# Patient Record
Sex: Male | Born: 2005 | Race: White | Hispanic: No | Marital: Single | Smoking: Never smoker
Health system: Southern US, Community
[De-identification: ages and names within clinical notes are randomized; demographics above are authoritative.]

## PROBLEM LIST (undated history)

## (undated) DIAGNOSIS — J45909 Unspecified asthma, uncomplicated: Secondary | ICD-10-CM

## (undated) DIAGNOSIS — G47419 Narcolepsy without cataplexy: Secondary | ICD-10-CM

## (undated) DIAGNOSIS — T7840XA Allergy, unspecified, initial encounter: Secondary | ICD-10-CM

## (undated) DIAGNOSIS — Z8659 Personal history of other mental and behavioral disorders: Secondary | ICD-10-CM

## (undated) DIAGNOSIS — F913 Oppositional defiant disorder: Secondary | ICD-10-CM

## (undated) DIAGNOSIS — Z634 Disappearance and death of family member: Secondary | ICD-10-CM

## (undated) DIAGNOSIS — F909 Attention-deficit hyperactivity disorder, unspecified type: Secondary | ICD-10-CM

## (undated) DIAGNOSIS — F819 Developmental disorder of scholastic skills, unspecified: Secondary | ICD-10-CM

## (undated) DIAGNOSIS — F88 Other disorders of psychological development: Secondary | ICD-10-CM

## (undated) HISTORY — DX: Narcolepsy without cataplexy: G47.419

## (undated) HISTORY — DX: Disappearance and death of family member: Z63.4

## (undated) HISTORY — DX: Oppositional defiant disorder: F91.3

## (undated) HISTORY — DX: Allergy, unspecified, initial encounter: T78.40XA

## (undated) HISTORY — DX: Personal history of other mental and behavioral disorders: Z86.59

## (undated) HISTORY — DX: Attention-deficit hyperactivity disorder, unspecified type: F90.9

## (undated) HISTORY — DX: Developmental disorder of scholastic skills, unspecified: F81.9

## (undated) HISTORY — DX: Other disorders of psychological development: F88

## (undated) HISTORY — DX: Unspecified asthma, uncomplicated: J45.909

---
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
## 2024-06-29 ENCOUNTER — Encounter: Payer: Self-pay | Admitting: Neurology

## 2024-07-01 ENCOUNTER — Telehealth: Payer: MEDICAID | Admitting: Neurology

## 2024-07-08 ENCOUNTER — Telehealth: Payer: MEDICAID | Admitting: Neurology

## 2024-07-16 ENCOUNTER — Encounter: Payer: Self-pay | Admitting: *Deleted

## 2024-08-06 ENCOUNTER — Encounter: Payer: Self-pay | Admitting: Pediatrics

## 2024-08-06 ENCOUNTER — Ambulatory Visit: Payer: MEDICAID | Admitting: Pediatrics

## 2024-08-06 VITALS — BP 112/72 | HR 84 | Temp 98.3°F | Ht 66.0 in | Wt 190.4 lb

## 2024-08-06 DIAGNOSIS — R300 Dysuria: Secondary | ICD-10-CM

## 2024-08-06 DIAGNOSIS — J309 Allergic rhinitis, unspecified: Secondary | ICD-10-CM

## 2024-08-06 DIAGNOSIS — Z23 Encounter for immunization: Secondary | ICD-10-CM

## 2024-08-06 DIAGNOSIS — L210 Seborrhea capitis: Secondary | ICD-10-CM | POA: Diagnosis not present

## 2024-08-06 DIAGNOSIS — Z00121 Encounter for routine child health examination with abnormal findings: Secondary | ICD-10-CM

## 2024-08-06 DIAGNOSIS — H527 Unspecified disorder of refraction: Secondary | ICD-10-CM

## 2024-08-06 DIAGNOSIS — Z68.41 Body mass index (BMI) pediatric, greater than or equal to 95th percentile for age: Secondary | ICD-10-CM

## 2024-08-06 DIAGNOSIS — Z0001 Encounter for general adult medical examination with abnormal findings: Secondary | ICD-10-CM | POA: Diagnosis not present

## 2024-08-06 DIAGNOSIS — L858 Other specified epidermal thickening: Secondary | ICD-10-CM

## 2024-08-06 DIAGNOSIS — Z136 Encounter for screening for cardiovascular disorders: Secondary | ICD-10-CM

## 2024-08-06 DIAGNOSIS — G47419 Narcolepsy without cataplexy: Secondary | ICD-10-CM

## 2024-08-06 DIAGNOSIS — F88 Other disorders of psychological development: Secondary | ICD-10-CM

## 2024-08-06 LAB — POCT URINALYSIS DIPSTICK
Bilirubin, UA: NEGATIVE
Blood, UA: NEGATIVE
Glucose, UA: NEGATIVE
Ketones, UA: NEGATIVE
Leukocytes, UA: NEGATIVE
Nitrite, UA: NEGATIVE
Protein, UA: NEGATIVE
Spec Grav, UA: 1.01 (ref 1.010–1.025)
Urobilinogen, UA: 0.2 U/dL
pH, UA: 8 (ref 5.0–8.0)

## 2024-08-06 MED ORDER — CETIRIZINE HCL 10 MG PO TABS
10.0000 mg | ORAL_TABLET | Freq: Every day | ORAL | 5 refills | Status: AC
Start: 2024-08-06 — End: ?

## 2024-08-06 MED ORDER — KETOCONAZOLE 2 % EX SHAM: 1.0000 | MEDICATED_SHAMPOO | CUTANEOUS | 0 refills | Status: AC
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
## 2024-08-19 LAB — COMPREHENSIVE METABOLIC PANEL WITH GFR
AG Ratio: 2.3 (calc) (ref 1.0–2.5)
ALT: 47 U/L — ABNORMAL HIGH (ref 8–46)
AST: 24 U/L (ref 12–32)
Albumin: 4.9 g/dL (ref 3.6–5.1)
Alkaline phosphatase (APISO): 76 U/L (ref 46–169)
BUN: 15 mg/dL (ref 7–20)
CO2: 29 mmol/L (ref 20–32)
Calcium: 9.6 mg/dL (ref 8.9–10.4)
Chloride: 104 mmol/L (ref 98–110)
Creat: 1.05 mg/dL (ref 0.60–1.24)
Globulin: 2.1 g/dL (ref 2.1–3.5)
Glucose, Bld: 83 mg/dL (ref 65–99)
Potassium: 3.8 mmol/L (ref 3.8–5.1)
Sodium: 142 mmol/L (ref 135–146)
Total Bilirubin: 0.9 mg/dL (ref 0.2–1.1)
Total Protein: 7 g/dL (ref 6.3–8.2)
eGFR: 106 mL/min/1.73m2 (ref >=60)

## 2024-08-19 LAB — LIPID PANEL
Cholesterol: 147 mg/dL (ref <170)
HDL: 34 mg/dL — ABNORMAL LOW (ref >45)
LDL Cholesterol (Calc): 73 mg/dL (ref <110)
Non-HDL Cholesterol (Calc): 113 mg/dL (ref <120)
Total CHOL/HDL Ratio: 4.3 (calc) (ref <5.0)
Triglycerides: 330 mg/dL — ABNORMAL HIGH (ref <90)

## 2024-08-19 LAB — CBC WITH DIFFERENTIAL/PLATELET
Absolute Lymphocytes: 2449 {cells}/uL (ref 1200–5200)
Absolute Monocytes: 741 {cells}/uL (ref 200–900)
Basophils Absolute: 39 {cells}/uL (ref 0–200)
Basophils Relative: 0.5 %
Eosinophils Absolute: 211 {cells}/uL (ref 15–500)
Eosinophils Relative: 2.7 %
HCT: 49.2 % — ABNORMAL HIGH (ref 36.0–49.0)
Hemoglobin: 16.2 g/dL (ref 12.0–16.9)
MCH: 28.7 pg (ref 25.0–35.0)
MCHC: 32.9 g/dL (ref 31.0–36.0)
MCV: 87.2 fL (ref 78.0–98.0)
MPV: 11.4 fL (ref 7.5–12.5)
Monocytes Relative: 9.5 %
Neutro Abs: 4360 {cells}/uL (ref 1800–8000)
Neutrophils Relative %: 55.9 %
Platelets: 197 Thousand/uL (ref 140–400)
RBC: 5.64 Million/uL (ref 4.10–5.70)
RDW: 13.1 % (ref 11.0–15.0)
Total Lymphocyte: 31.4 %
WBC: 7.8 Thousand/uL (ref 4.5–13.0)

## 2024-08-23 ENCOUNTER — Ambulatory Visit: Payer: Self-pay | Admitting: Pediatrics

## 2024-08-23 DIAGNOSIS — E781 Pure hyperglyceridemia: Secondary | ICD-10-CM

## 2024-09-27 ENCOUNTER — Telehealth: Payer: MEDICAID | Admitting: Neurology

## 2024-09-27 ENCOUNTER — Encounter: Payer: Self-pay | Admitting: Neurology

## 2024-09-27 DIAGNOSIS — G47419 Narcolepsy without cataplexy: Secondary | ICD-10-CM | POA: Diagnosis not present

## 2024-09-27 DIAGNOSIS — G4719 Other hypersomnia: Secondary | ICD-10-CM | POA: Diagnosis not present

## 2024-09-27 DIAGNOSIS — F840 Autistic disorder: Secondary | ICD-10-CM

## 2024-09-27 DIAGNOSIS — T59811A Toxic effect of smoke, accidental (unintentional), initial encounter: Secondary | ICD-10-CM

## 2024-09-27 DIAGNOSIS — F819 Developmental disorder of scholastic skills, unspecified: Secondary | ICD-10-CM

## 2024-09-27 MED ORDER — MODAFINIL 200 MG PO TABS: 100.0000 mg | ORAL_TABLET | Freq: Every day | ORAL | 1 refills | Status: AC

## 2024-09-27 NOTE — Progress Notes (Signed)
 Virtual Visit via Video Note  I connected with Corey [name] on 09/27/24 at  1:30 PM EST by a video enabled telemedicine application and verified that I am speaking with the correct person using two identifiers.  Location: Patient: at  home.  Provider: at [facility]   I discussed the limitations of evaluation and management by telemedicine and the availability of in person appointments. The patient expressed understanding and agreed to proceed.  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 See MSLT results from 2022, [facility]. No side effects of current medications.   He stopped taking meds while he has no job yet.    Pt lives with older sister, her husband and two young boys He does stay home in the days and has no issues with his ADLS and helping out at home with chores and preparing his meals.  He has graduated from [employer] He does is not physically active much He also spends alot of time on the phone and playing video games  Corey [name] is a 18 y.o. 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  He lives with his sister since age 39, Their parents had both passed- he lost his mom tragically in a house fire-  He had been falling asleep in classes.  He fell once asleep at home and fell out of the chair.  He didn't yet graduate from high school, is in his senior year.  He is autistic.  He is treated on provigil  , and had to switch to nuvigil  due to  medicaid coverage of the drugs. He responds  well to either.  He has some morning headaches,  but usually gets headaches in school - and more often on Mondays. That may be a component of sleep deprivation  on the weekends.          Chief concern according to patient :   I just need someone to follow up on medication .     I have the pleasure of seeing Corey [name] 12/30/23 a  right -handed male with a  narcolepsy sleep disorder.  The patient had the first sleep study in the year 2022, his PSG took place on 13 May 2021 followed by an MSLT the next day.  The polysomnography was deemed valid for an MSLT to follow.  There was no apnea and no periodic limb movement noted.  The sleep physician was Dr. Reggy Salt.  The referral reason was excessive daytime sleepiness.  Sleep efficiency was 92.2%.  Sleep latency 15.5 minutes, REM sleep latency was 76 minutes.  However the MSLT that followed showed a mean sleep latency of less than 9 minutes and 1 sleep REM onset.  I agree with Dr. Reggy Beckwith interpretation that this patient has narcolepsy.      Sleep relevant medical history: Nocturia, no Sleep walking, Night terrors, other Parasomnia , no  Tonsillectomy,     Family medical /sleep history: no other family member on CPAP with OSA, insomnia, sleep walkers.    Social history:  Patient is in high school, lives with his older sister and brother in social worker and their baby-   and lives in a household  2 cats.  The patient currently  is a up until 3 AM on weekends to play on his phone.  Tobacco use: none .  ETOH use :  none ,  Caffeine intake in form of Coffee( /) Soda( 2-4  a day ) Tea ( /)  rarely Monster energy drinks     Corey [name] is a 18 y.o. 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  He is accompanied by his sister who is a legal guardian. He has been doing well. He takes Nuvigil  150 mg daily. He sleeps throughout the night. He reported no side effects from Nuvigil . His sister states that he has been headache but they think that could be related to recent infection. He had abscess in his left side of his neck. He is taking antibiotic and today is the last day of antibiotic treatment course. Karan said that he gets mild headache that lasts few minutes to an hour. Pain medication helps sometimes. He denied nausea or vomiting.       Observations/Objective:How likely are you to doze in the following situations: 0 = not likely, 1 = slight chance, 2 = moderate chance, 3 = high chance  Sitting and Reading? Watching Television? Sitting inactive in a public place (theater or meeting)? As a passenger in a car for an hour without a break? Lying down in the afternoon when circumstances permit? Sitting and talking to someone? Sitting quietly after lunch without alcohol? In a car, while stopped for a few minutes in traffic?   Total = 10 / 24 points.   FSS at 32/ 63     Assessment and Plan: Narcolepsy w/ cataplexy: pt graduated from school a few mths ago and has had no issues with excessive daytime sleepiness as he gets to nap whenever he wants. Pt does go to bed about 2 am and will get up anywhere from 6am-10am. He has no disruption in his sleep and no acting out his dream when he sleeps.   Autism and ADHD : Strattera  for ADHD had been prescribed through primary physician   but he doesn't really take it . Pt does not complain about low energy or feeling tired: au contraire pt states he has a lot of energy. Older sister agrees. He is not taking any medicine for it. Did miss his scheduled 6 mth f/up last month.       Follow Up Instructions:  The patient at home , not employed.  Finished / graduated high school in June- Lacks sleep- wake routines.  Medication works when he takes it.   I wanted to refill the medication at last dose modafinil  100 mg prn- but the patient feels he has no need to take this medication, in contrast to his sister (he fells he is doing fine, sister reports he is not ) .  He had the medication taken off the list on 08-06-2024.   I could offer Sunosi or wakix if needed-this may require a visit with his sister , present face to face.   I like for him to be as alert as possible to have a more successful job search.   Rv with Np in 6-8 months.   If Mr Boening will not take the medication, I cannot  help him further.   I offered to continue the medication his pediatric neurologist initiated and to refill Modafinil .   I will ask  Mr Frieson to keep regular sleep and wake habits through the weekends, not  going to bed later than  midnight - currently at 3 AM, needs to advance his sleep time by 30 minutes a  week until pre midnight.   Headaches may be related to sleep deprivation,  Ibuprofen  was prescribed for HA. Not to be taken more than 3 times a week, only with food. He reports he is controlling the HA with a long steam shower ( sinus?) .   I plan to follow up  through our NP within 6- 8 months.     I discussed the assessment and treatment plan with the patient. The patient was provided an opportunity to ask questions and all were answered. The patient agreed with the plan and demonstrated an understanding of the instructions.   The patient was advised to call back or seek an in-person evaluation if the symptoms worsen or if the condition fails to improve as anticipated.  I provided 30 minutes of non-face-to-face time during this encounter.   Dedra Gores, MD

## 2024-09-27 NOTE — Patient Instructions (Addendum)
 Narcolepsy Narcolepsy is a disorder that causes people to fall asleep suddenly and without control (have sleep attacks) during the daytime. It is a lifelong disorder. Narcolepsy disrupts the sleep cycle at night, which then causes daytime sleepiness. What are the causes? The cause of narcolepsy is not fully understood, but it may be related to: Low levels of hypocretin, a chemical (neurotransmitter) in the brain that controls sleep and wake cycles. Hypocretin imbalance may be caused by: Abnormal genes that are passed from parent to child (inherited). An autoimmune disease in which the body's defense system (immune system) attacks the brain cells that make hypocretin. Infection, tumor, or injury in the area of the brain that controls sleep. Exposure to poisons (toxins), such as heavy metals, pesticides, and secondhand smoke. What are the signs or symptoms? Symptoms of this condition include: Excessive daytime sleepiness. This is the most common symptom and is usually the first symptom you will notice. This may affect your performance at work or school. Sleep attacks. You may fall asleep in the middle of an activity, especially low-energy activities like reading or watching TV. Feeling like you cannot think clearly and having trouble focusing or remembering things. You may also feel depressed. Sudden muscle weakness (cataplexy). When this occurs, your speech may become slurred, or your knees may buckle. Cataplexy is usually triggered by surprise, anger, fear, or laughter. Losing the ability to speak or move (sleep paralysis). This may occur just as you start to fall asleep or wake up. You will be aware of the paralysis. It usually lasts for just a few seconds or minutes. Seeing, hearing, tasting, smelling, or feeling things that are not real (hallucinations). Hallucinations may occur with sleep paralysis. They can happen when you are falling asleep, waking up, or dozing. Trouble staying asleep at  night (insomnia) and restless sleep. How is this diagnosed? This condition may be diagnosed based on: A physical exam to rule out any other problems that may be causing your symptoms. You may be asked to write down your sleeping patterns for several weeks in a sleep diary. This will help your health care provider make a diagnosis. Sleep studies that measure how well your REM sleep is regulated. These tests also measure your heart rate, breathing, movement, and brain waves. These tests include: An overnight sleep study (polysomnogram). A daytime sleep study that is done while you take several naps during the day (multiple sleep latency test, MSLT). This test measures how quickly you fall asleep and how quickly you enter REM sleep. Removal of spinal fluid to measure hypocretin levels. How is this treated? There is no cure for this condition, but treatment can help relieve symptoms. Treatment may include: Lifestyle and sleeping strategies to help you cope with the condition, such as: Exercising regularly. Maintaining a regular sleep schedule. Avoiding caffeine and large meals before bed. Medicines. These may include: Medicines that help keep you awake and alert (stimulants) to fight daytime sleepiness. Medicines that treat depression (antidepressants). These may be used to treat cataplexy. Sodium oxybate. This is a strong medicine to help you relax (sedative) that you may take at night. It can help control daytime sleepiness and cataplexy. Other treatments may include mental health counseling or joining a support group. Follow these instructions at home: Sleeping habits  Get about 8 hours of sleep every night. Go to sleep and get up at about the same time every day. Keep your bedroom dark, quiet, and comfortable. When you feel very tired, take short naps. Schedule naps  so that you take them at about the same time every day. Before bedtime: Avoid bright lights and screens. Relax. Try  activities like reading or taking a warm bath. Activity Get at least 20 minutes of exercise every day. This will help you sleep better at night and reduce daytime sleepiness. Avoid exercising within 3 hours of bedtime. Do not drive or use machinery if you are sleepy. If possible, take a nap before driving. Do not swim or go out on the water without a life jacket. Eating and drinking Do not drink alcohol or caffeinated beverages within 4-5 hours of bedtime. Do not eat a large meal before bedtime. Eat meals at about the same times every day. General instructions  Take over-the-counter and prescription medicines only as told by your health care provider. Keep a sleep diary as told by your health care provider. Tell your employer or teachers that you have narcolepsy. You may be able to adjust your schedule to include time for naps. Do not use any products that contain nicotine or tobacco. These products include cigarettes, chewing tobacco, and vaping devices, such as e-cigarettes. If you need help quitting, ask your health care provider. Where to find more information [employer] of Neurological Disorders and Stroke: basicfm.no Contact a health care provider if: Your symptoms are not getting better. You have fast or irregular heartbeats (palpitations). You are having a hard time determining what is real and what is not (psychosis). Get help right away if: You hurt yourself during a sleep attack or an attack of cataplexy. You have chest pain. You have trouble breathing. These symptoms may be an emergency. Get help right away. Call 911. Do not wait to see if the symptoms will go away. Do not drive yourself to the hospital. Summary Narcolepsy is a disorder that causes people to fall asleep suddenly and without control during the daytime (sleep attacks). Narcolepsy is a lifelong disorder with no cure. Treatment can help relieve symptoms. Go to sleep and get up at about the same time  every day. Follow instructions about sleep and activities as told by your health care provider. Take over-the-counter and prescription medicines only as told by your health care provider. This information is not intended to replace advice given to you by your health care provider. Make sure you discuss any questions you have with your health care provider. Document Revised: 02/15/2022 Document Reviewed: 02/15/2022 Elsevier Patient Education  2024 Elsevier Inc.   These medication for treatment of excessive daytime sleepiness;   SUNOSI and WAKIX:   Information below:  Solriamfetol Tablets What is this medication? SOLRIAMFETOL (sol ri AM fe tol) treats sleep disorders, such as narcolepsy and obstructive sleep apnea. It works by promoting wakefulness. This medicine may be used for other purposes; ask your health care provider or pharmacist if you have questions. COMMON BRAND NAME(S): SUNOSI What should I tell my care team before I take this medication? They need to know if you have any of these conditions: Bipolar disorder Diabetes Heart disease High blood pressure High cholesterol History of stroke Kidney disease Schizophrenia Substance use disorder An unusual or allergic reaction to solriamfetol, other medications, foods, dyes, or preservatives Pregnant or trying to get pregnant Breast-feeding How should I use this medication? Take this medication by mouth with water. Take it as directed on the prescription label at the same time every day. You can take it with or without food. If it upsets your stomach, take it with food. Keep taking it unless your  care team tells you to stop. A special MedGuide will be given to you by the pharmacist with each prescription and refill. Be sure to read this information carefully each time. Talk to your care team about the use of this medication in children. Special care may be needed. Overdosage: If you think you have taken too much of this medicine  contact a poison control center or emergency room at once. NOTE: This medicine is only for you. Do not share this medicine with others. What if I miss a dose? If you miss a dose, take it as soon as you can. However, avoid taking it within 9 hours of your planned bedtime, since you may find it harder to go to sleep. If it is almost time for your next dose, take only that dose. Do not take double or extra doses. What may interact with this medication? Do not take this medication with any of the following: MAOIs, such as Marplan, Nardil, and Parnate This medication may also interact with the following: Certain medications for Parkinson disease, such as levodopa, pramipexole, ropinirole Medications that increase blood pressure or heart rate This list may not describe all possible interactions. Give your health care provider a list of all the medicines, herbs, non-prescription drugs, or dietary supplements you use. Also tell them if you smoke, drink alcohol, or use illegal drugs. Some items may interact with your medicine. What should I watch for while using this medication? Visit your care team for regular checks on your progress. Tell your care team if your symptoms do not start to get better or if they get worse. This medication has a risk of abuse and dependence. Your care team will check you for this while you take this medication. Talk to your care team before breastfeeding. Changes to your treatment plan may be needed. What side effects may I notice from receiving this medication? Side effects that you should report to your care team as soon as possible: Allergic reactions--skin rash, itching, hives, swelling of the face, lips, tongue, or throat Fast or irregular heartbeat Heart attack--pain or tightness in the chest, shoulders, arms, or jaw, nausea, shortness of breath, cold or clammy skin, feeling faint or lightheaded Increase in blood pressure Mood and behavior changes--anxiety,  nervousness, confusion, hallucinations, irritability, hostility, thoughts of suicide or self-harm, worsening mood, feelings of depression Stroke--sudden numbness or weakness of the face, arm, or leg, trouble speaking, confusion, trouble walking, loss of balance or coordination, dizziness, severe headache, change in vision Side effects that usually do not require medical attention (report these to your care team if they continue or are bothersome): Anxiety, nervousness Headache Loss of appetite Nausea Trouble sleeping This list may not describe all possible side effects. Call your doctor for medical advice about side effects. You may report side effects to FDA at 1-800-FDA-1088. Where should I keep my medication? Keep out of the reach of children and pets. This medication can be abused. Keep it in a safe place to protect it from theft. Do not share it with anyone. It is only for you. Selling or giving away this medication is dangerous and against the law. Store at room temperature between 20 and 25 degrees C (68 and 77 degrees F). This medication may cause harm or death if it is taken by other adults, children, or pets. It is important to get rid of the medication as soon as you no longer need it or it is expired. You can do this in two ways: Take  the medication to a medication take-back program. Check with your pharmacy or law enforcement to find a location. If you cannot return the medication, check the label or package insert to see if the medication should be thrown out in the garbage or flushed down the toilet. If you are not sure, ask your care team. If it is safe to put it in the trash, empty the medication out of the container. Mix the medication with cat litter, dirt, or other unwanted substance. Seal the mixture in a bag or container. Put it in the trash. NOTE: This sheet is a summary. It may not cover all possible information. If you have questions about this medicine, talk to your doctor,  pharmacist, or health care provider.  2024 Elsevier/Gold Standard (2022-05-03 00:00:00)   Pitolisant Tablets What is this medication? PITOLISANT (pi TOL i sant) treats narcolepsy. It works by decreasing daytime sleepiness, which can help you sleep better at night. It also reduces the number of episodes of sudden muscle weakness (cataplexy). This medicine may be used for other purposes; ask your health care provider or pharmacist if you have questions. COMMON BRAND NAME(S): Wakix What should I tell my care team before I take this medication? They need to know if you have any of these conditions: Heart disease History of irregular heartbeat Kidney disease Liver disease Low levels of potassium in the blood Low levels of magnesium in the blood An unusual or allergic reaction to pitolisant, other medications, foods, dyes or preservatives Pregnant or trying to get pregnant Breastfeeding How should I use this medication? Take this medication by mouth with water. Take it as directed on the prescription label at the same time every day. You can take it with or without food. If it upsets your stomach, take it with food. Keep taking it unless your care team tells you to stop. Talk to your care team about the use of this medication in children. While it may be prescribed for children as young as 6 years for selected conditions, precautions do apply. Overdosage: If you think you have taken too much of this medicine contact a poison control center or emergency room at once. NOTE: This medicine is only for you. Do not share this medicine with others. What if I miss a dose? If you miss a dose, skip it. Take your next dose at the normal time. Do not take extra or 2 doses at the same time to make up for the missed dose. What may interact with this medication? Do not take this medication with any of the following: Cisapride Dronedarone Pimozide Thioridazine This medication may also interact with the  following: Antihistamines for allergy, cough and cold Certain medications for irregular heartbeat, such as amiodarone, dofetilide, encainide, flecainide, propafenone, quinidine Certain medications for depression, anxiety, or other mental health conditions Certain medications for seizures, such as carbamazepine, phenobarbital, phenytoin Cyclosporine Estrogen or progestin hormones Midazolam  Other medications that cause heart rhythm changes Promethazine Rifampin St. John's wort This list may not describe all possible interactions. Give your health care provider a list of all the medicines, herbs, non-prescription drugs, or dietary supplements you use. Also tell them if you smoke, drink alcohol, or use illegal drugs. Some items may interact with your medicine. What should I watch for while using this medication? Visit your care team for regular checks on your progress. Tell your care team if your symptoms do not start to get better or if they get worse. Your mouth may get dry. Chewing sugarless  gum or sucking hard candy and drinking plenty of water may help. Contact your care team if the problem does not go away or is severe. Estrogen and progestin hormones may not work as well while you are taking this medication. If you are using these hormones for contraception, talk to your care team about using a second type of contraception. A barrier contraceptive, such as a condom or diaphragm, is recommended. What side effects may I notice from receiving this medication? Side effects that you should report to your care team as soon as possible: Allergic reactions--skin rash, itching, hives, swelling of the face, lips, tongue, or throat Heart rhythm changes--fast or irregular heartbeat, dizziness, feeling faint or lightheaded, chest pain, trouble breathing Side effects that usually do not require medical attention (report these to your care team if they continue or are bothersome): Anxiety,  nervousness Headache Nausea Trouble sleeping This list may not describe all possible side effects. Call your doctor for medical advice about side effects. You may report side effects to FDA at 1-800-FDA-1088. Where should I keep my medication? Keep out of the reach of children and pets. Store at room temperature between 20 and 25 degrees C (68 and 77 degrees F). Get rid of any unused medication after the expiration date. To get rid of medications that are no longer needed or have expired: Take the medication to a medication take-back program. Check with your pharmacy or law enforcement to find a location. If you cannot return the medication, check the label or package insert to see if the medication should be thrown out in the garbage or flushed down the toilet. If you are not sure, ask your care team. If it is safe to put it in the trash, empty the medication out of the container. Mix the medication with cat litter, dirt, or other unwanted substance. Seal the mixture in a bag or container. Put it in the trash. NOTE: This sheet is a summary. It may not cover all possible information. If you have questions about this medicine, talk to your doctor, pharmacist, or health care provider.  2024 Elsevier/Gold Standard (2023-04-23 00:00:00)   You have a RV with NP in 6-8 months-   In the meantime you have refills on modafinil , 1/2 tab of 200 mg in AM to help you start your day and in a job soon.   Labs at that time.
